# Patient Record
Sex: Female | Born: 1995 | Race: White | Hispanic: Yes | Marital: Single
Health system: Southern US, Community
[De-identification: ages and names within clinical notes are randomized; demographics above are authoritative.]

---
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
## 2014-12-12 IMAGING — CR DG CHEST 2V
2 series · 2 of 2 positions shown · non-contrast
Comparison: None.

CLINICAL DATA: Tuberculosis screening examination.

EXAM:
CHEST  2 VIEW

[w chest pa]
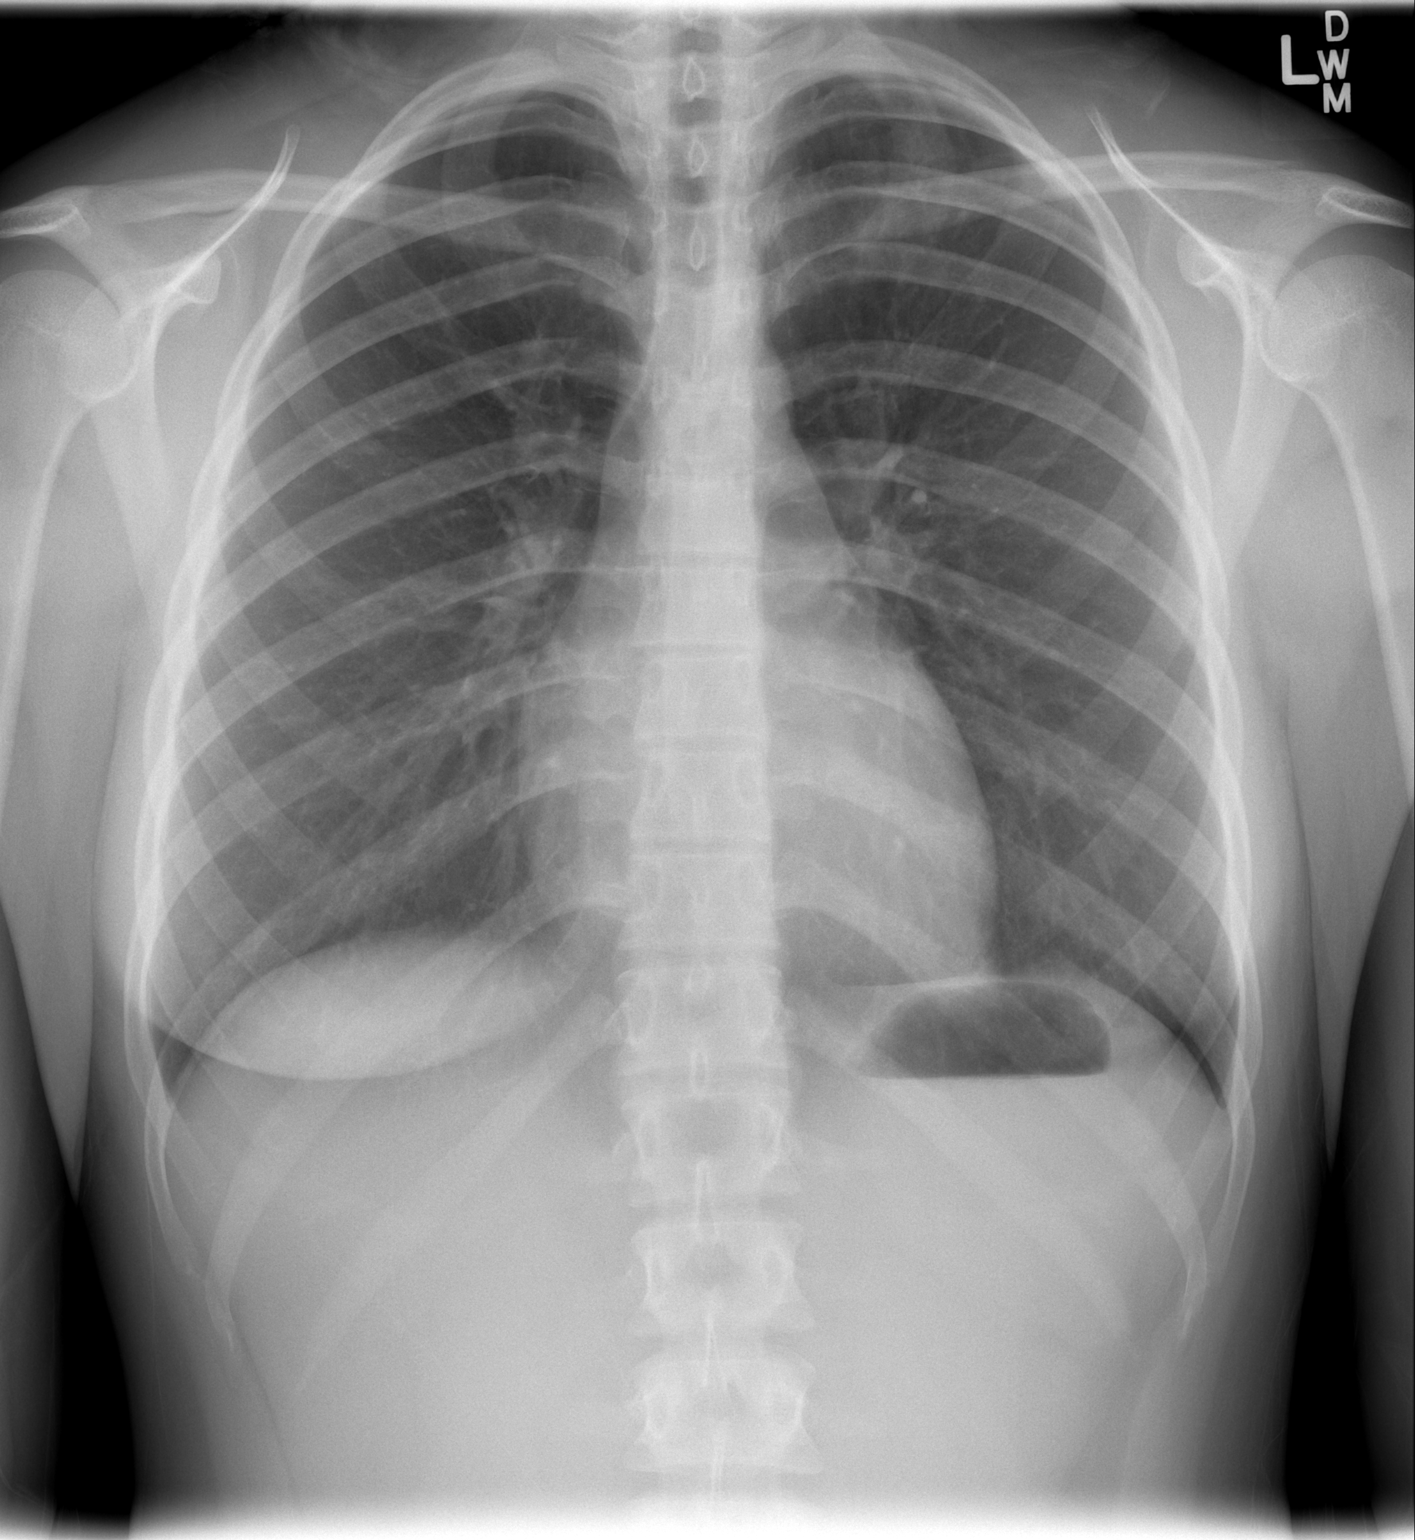

[w chest lat]
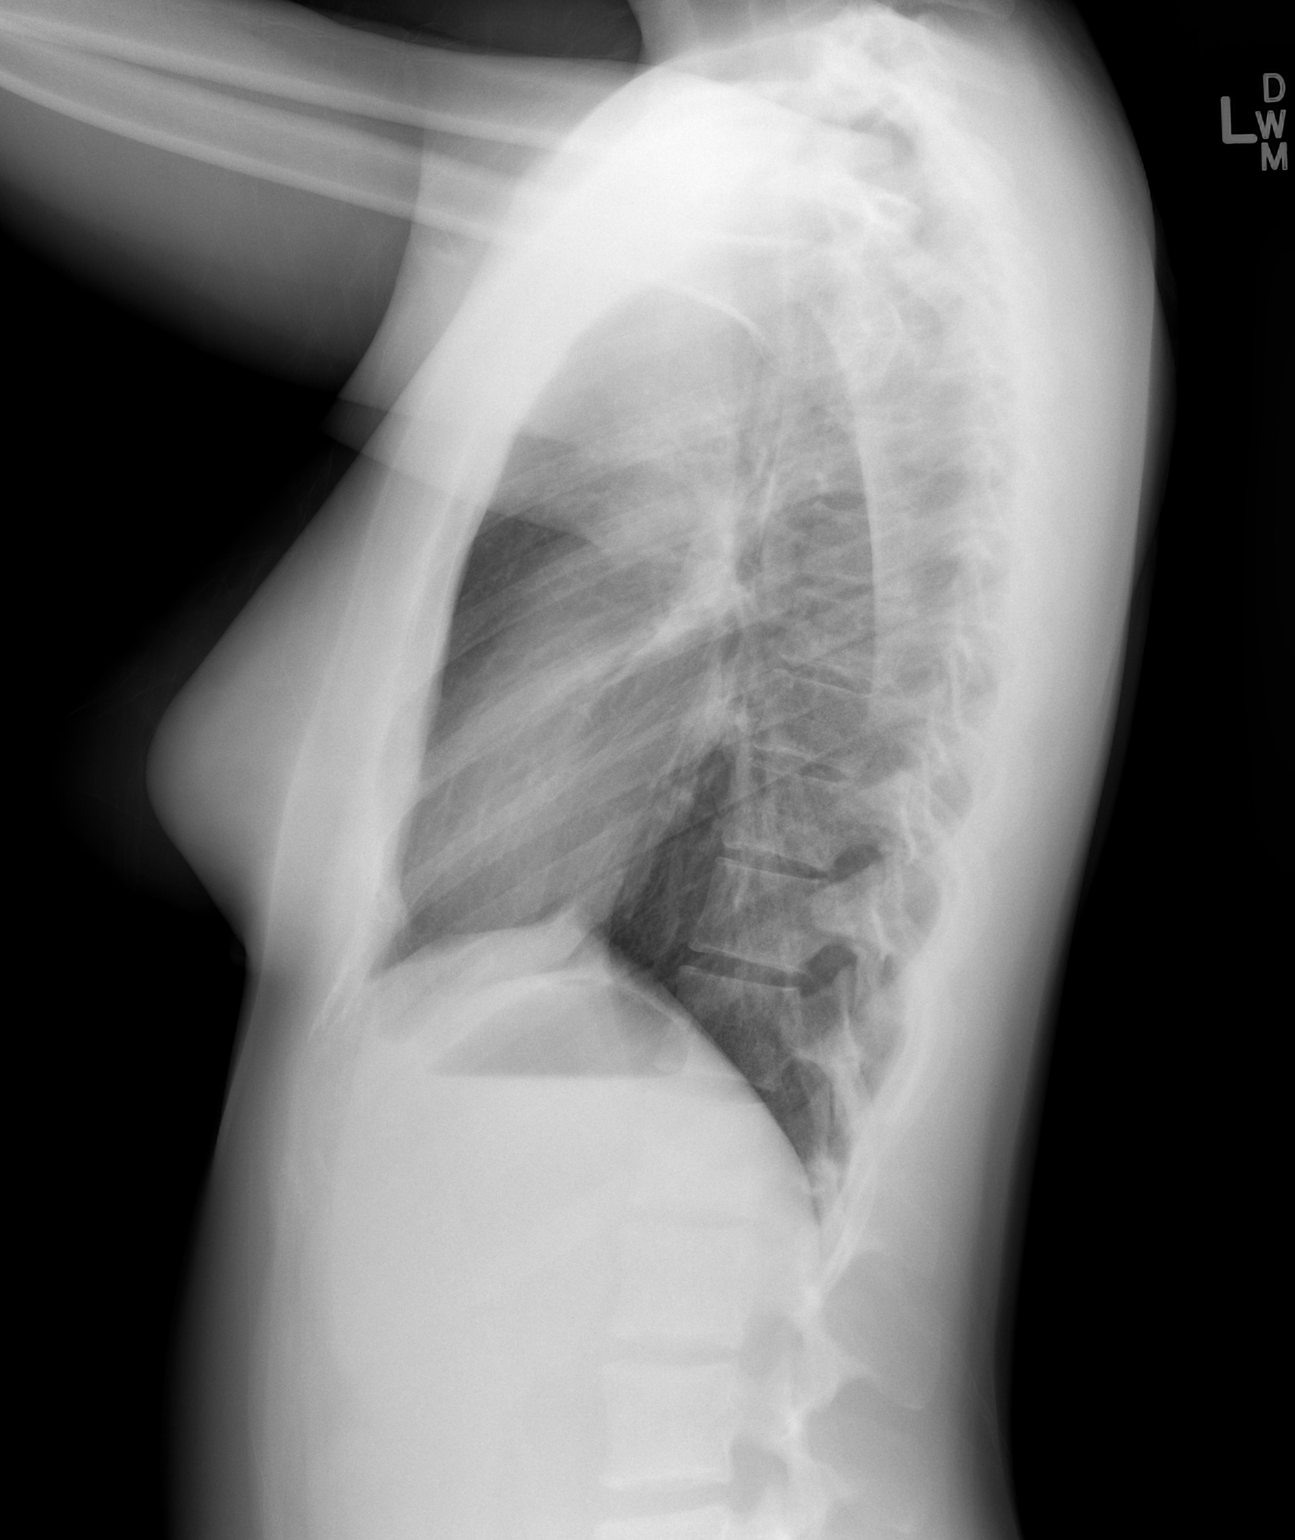

[2 of 2 positions shown; findings below may reference images not displayed]

FINDINGS: Heart size and mediastinal contours are within normal limits. Both
lungs are clear. Visualized skeletal structures are unremarkable.
IMPRESSION: Negative exam.
